# Patient Record
Sex: Male | Born: 1970 | Race: White | Hispanic: No | Marital: Single | Smoking: Never smoker
Health system: Southern US, Community
[De-identification: ages and names within clinical notes are randomized; demographics above are authoritative.]

---

## 2002-05-07 ENCOUNTER — Emergency Department (HOSPITAL_COMMUNITY): Admission: EM | Admit: 2002-05-07 | Discharge: 2002-05-07 | Payer: Self-pay | Admitting: Emergency Medicine

## 2006-08-01 ENCOUNTER — Emergency Department (HOSPITAL_COMMUNITY): Admission: EM | Admit: 2006-08-01 | Discharge: 2006-08-01 | Payer: Self-pay | Admitting: Emergency Medicine

## 2009-06-01 ENCOUNTER — Emergency Department (HOSPITAL_COMMUNITY): Admission: EM | Admit: 2009-06-01 | Discharge: 2009-06-01 | Payer: Self-pay | Admitting: Emergency Medicine

## 2010-11-28 LAB — DIFFERENTIAL
Basophils Absolute: 0 10*3/uL (ref 0.0–0.1)
Eosinophils Absolute: 0.1 10*3/uL (ref 0.0–0.7)
Eosinophils Relative: 2 % (ref 0–5)
Lymphocytes Relative: 49 % — ABNORMAL HIGH (ref 12–46)
Lymphs Abs: 3.8 10*3/uL (ref 0.7–4.0)
Monocytes Absolute: 0.7 10*3/uL (ref 0.1–1.0)
Neutro Abs: 3.1 10*3/uL (ref 1.7–7.7)
Neutrophils Relative %: 41 % — ABNORMAL LOW (ref 43–77)

## 2010-11-28 LAB — BASIC METABOLIC PANEL
BUN: 12 mg/dL (ref 6–23)
CO2: 26 mEq/L (ref 19–32)
Chloride: 101 mEq/L (ref 96–112)
GFR calc non Af Amer: >60 mL/min (ref >60)
Sodium: 135 mEq/L (ref 135–145)

## 2010-11-28 LAB — POCT CARDIAC MARKERS
CKMB, poc: 1.4 ng/mL (ref 1.0–8.0)
Myoglobin, poc: 45 ng/mL (ref 12–200)

## 2010-11-28 LAB — CBC
Hemoglobin: 15.7 g/dL (ref 13.0–17.0)
MCHC: 35.6 g/dL (ref 30.0–36.0)
MCV: 97 fL (ref 78.0–100.0)
RDW: 12.2 % (ref 11.5–15.5)

## 2014-01-02 ENCOUNTER — Encounter (HOSPITAL_COMMUNITY): Payer: Self-pay | Admitting: Emergency Medicine

## 2014-01-02 ENCOUNTER — Emergency Department (HOSPITAL_COMMUNITY)
Admission: EM | Admit: 2014-01-02 | Discharge: 2014-01-02 | Disposition: A | Discharge status: Home / Self Care | Payer: BC Managed Care – PPO | Attending: Emergency Medicine | Admitting: Emergency Medicine

## 2014-01-02 DIAGNOSIS — R079 Chest pain, unspecified: Secondary | ICD-10-CM | POA: Insufficient documentation

## 2014-01-02 DIAGNOSIS — R748 Abnormal levels of other serum enzymes: Secondary | ICD-10-CM | POA: Insufficient documentation

## 2014-01-02 DIAGNOSIS — E860 Dehydration: Secondary | ICD-10-CM | POA: Insufficient documentation

## 2014-01-02 DIAGNOSIS — R42 Dizziness and giddiness: Secondary | ICD-10-CM | POA: Insufficient documentation

## 2014-01-02 DIAGNOSIS — Z7982 Long term (current) use of aspirin: Secondary | ICD-10-CM | POA: Insufficient documentation

## 2014-01-02 DIAGNOSIS — Z79899 Other long term (current) drug therapy: Secondary | ICD-10-CM | POA: Insufficient documentation

## 2014-01-02 DIAGNOSIS — E876 Hypokalemia: Secondary | ICD-10-CM

## 2014-01-02 DIAGNOSIS — Z791 Long term (current) use of non-steroidal anti-inflammatories (NSAID): Secondary | ICD-10-CM | POA: Insufficient documentation

## 2014-01-02 DIAGNOSIS — R209 Unspecified disturbances of skin sensation: Secondary | ICD-10-CM | POA: Insufficient documentation

## 2014-01-02 LAB — URINALYSIS, ROUTINE W REFLEX MICROSCOPIC
BILIRUBIN URINE: NEGATIVE
GLUCOSE, UA: NEGATIVE mg/dL
HGB URINE DIPSTICK: NEGATIVE
Ketones, ur: NEGATIVE mg/dL
LEUKOCYTES UA: NEGATIVE
Nitrite: NEGATIVE
PH: 6 (ref 5.0–8.0)
Protein, ur: NEGATIVE mg/dL
Specific Gravity, Urine: 1.018 (ref 1.005–1.030)
UROBILINOGEN UA: 0.2 mg/dL (ref 0.0–1.0)

## 2014-01-02 LAB — CBC
HCT: 40.7 % (ref 39.0–52.0)
HEMOGLOBIN: 14.6 g/dL (ref 13.0–17.0)
MCH: 34 pg (ref 26.0–34.0)
MCHC: 35.9 g/dL (ref 30.0–36.0)
MCV: 94.7 fL (ref 78.0–100.0)
Platelets: 202 10*3/uL (ref 150–400)
RBC: 4.3 MIL/uL (ref 4.22–5.81)
RDW: 11.7 % (ref 11.5–15.5)
WBC: 5.5 10*3/uL (ref 4.0–10.5)

## 2014-01-02 LAB — BASIC METABOLIC PANEL
BUN: 11 mg/dL (ref 6–23)
CHLORIDE: 101 meq/L (ref 96–112)
CO2: 27 meq/L (ref 19–32)
Calcium: 9.5 mg/dL (ref 8.4–10.5)
Creatinine, Ser: 1.15 mg/dL (ref 0.50–1.35)
GFR calc non Af Amer: 76 mL/min — ABNORMAL LOW (ref >90)
GFR, EST AFRICAN AMERICAN: 89 mL/min — AB (ref >90)
Glucose, Bld: 138 mg/dL — ABNORMAL HIGH (ref 70–99)
POTASSIUM: 3.4 meq/L — AB (ref 3.7–5.3)
Sodium: 141 mEq/L (ref 137–147)

## 2014-01-02 LAB — I-STAT TROPONIN, ED: Troponin i, poc: 0 ng/mL (ref 0.00–0.08)

## 2014-01-02 LAB — CK: Total CK: 275 U/L — ABNORMAL HIGH (ref 7–232)

## 2014-01-02 MED ORDER — POTASSIUM CHLORIDE CRYS ER 20 MEQ PO TBCR
40.0000 meq | EXTENDED_RELEASE_TABLET | Freq: Once | ORAL | Status: AC
  Administered 2014-01-02: 40 meq via ORAL
  Filled 2014-01-02: qty 2

## 2014-01-02 MED ORDER — SODIUM CHLORIDE 0.9 % IV BOLUS (SEPSIS)
1000.0000 mL | Freq: Once | INTRAVENOUS | Status: AC
  Administered 2014-01-02: 1000 mL via INTRAVENOUS

## 2014-01-02 NOTE — Discharge Instructions (Signed)
As discussed, it is important that you follow up with your physician for continued management of your condition.  Please be sure to have your potassium re-checked.  If you develop any new, or concerning changes in your condition, please return to the emergency department immediately.

## 2014-01-02 NOTE — ED Notes (Signed)
Per pt sts that he had an episode of chest pain, dizziness and his right arm felt funny about 30 minutes ago. Pt tachy at triage in the 130s. sts he has been working in the heat today and walking a lot.

## 2014-01-02 NOTE — ED Notes (Signed)
Family at bedside. Pt given water to drink.

## 2014-01-02 NOTE — ED Provider Notes (Signed)
CSN: [id number]     Arrival date & time 01/02/14  1627 History   First MD Initiated Contact with Patient 01/02/14 1739     Chief Complaint  Patient presents with  . Dizziness  . Chest Pain      HPI  Patient presents after an episode of dizziness, chest pain, right arm dysesthesia. Symptoms began earlier today, have resolved almost entirely prior to my evaluation, and specifically the patient has no ongoing chest pain or lightheadedness. Patient is in generally well until the onset of symptoms. Patient does note that he has been recently very busy at work, with significant exertion, including outdoors, where is currently 85. Patient denies history of cardiac disease, smoking. Patient does drink. Symptoms resolved without clear intervention. When the pain is there was brief, sternal, discomfort, nonradiating, with no associated dyspnea. Patient denies recent fevers, chills, cough, lower extremity edema.   History reviewed. No pertinent past medical history. History reviewed. No pertinent past surgical history. History reviewed. No pertinent family history. History  Substance Use Topics  . Smoking status: Never Smoker   . Smokeless tobacco: Not on file  . Alcohol Use: Yes    Review of Systems  Constitutional:       Per HPI, otherwise negative  HENT:       Per HPI, otherwise negative  Respiratory:       Per HPI, otherwise negative  Cardiovascular:       Per HPI, otherwise negative  Gastrointestinal: Negative for vomiting.  Endocrine:       Negative aside from HPI  Genitourinary:       Neg aside from HPI   Musculoskeletal:       Per HPI, otherwise negative  Skin: Negative.   Neurological: Negative for syncope.      Allergies  Review of patient's allergies indicates no known allergies.  Home Medications   Prior to Admission medications   Medication Sig Start Date End Date Taking? Authorizing Provider  Aspirin-Salicylamide-Caffeine (BC HEADACHE) 325-95-16 MG  TABS Take 1 Package by mouth daily as needed (for pain).   Yes Historical Provider, MD  fexofenadine (ALLEGRA) 180 MG tablet Take 180 mg by mouth daily.   Yes Historical Provider, MD  ibuprofen (ADVIL,MOTRIN) 200 MG tablet Take 400 mg by mouth every 6 (six) hours as needed for moderate pain.   Yes Historical Provider, MD  Multiple Vitamin (MULTIVITAMIN WITH MINERALS) TABS tablet Take 1 tablet by mouth daily.   Yes Historical Provider, MD  ranitidine (ZANTAC) 150 MG tablet Take 150 mg by mouth daily.   Yes Historical Provider, MD   BP 142/85  Pulse 135  Temp(Src) 98.2 F (36.8 C)  Resp 18  Wt 185 lb (83.915 kg)  SpO2 98% Physical Exam  Nursing note and vitals reviewed. Constitutional: He is oriented to person, place, and time. He appears well-developed. No distress.  HENT:  Head: Normocephalic and atraumatic.  Eyes: Conjunctivae and EOM are normal.  Cardiovascular: Normal rate and regular rhythm.   Pulmonary/Chest: Effort normal. No stridor. No respiratory distress.  Abdominal: He exhibits no distension.  Musculoskeletal: He exhibits no edema.  Neurological: He is alert and oriented to person, place, and time. He displays no atrophy and no tremor. No cranial nerve deficit or sensory deficit. He exhibits normal muscle tone. He displays no seizure activity. Coordination normal.  Skin: Skin is warm and dry.  Psychiatric: He has a normal mood and affect. His speech is normal and behavior is normal. Thought content normal.  ED Course  Procedures (including critical care time) Labs Review Labs Reviewed  BASIC METABOLIC PANEL - Abnormal; Notable for the following:    Potassium 3.4 (*)    Glucose, Bld 138 (*)    GFR calc non Af Amer 76 (*)    GFR calc Af Amer 89 (*)    All other components within normal limits  CBC  CK  URINALYSIS, ROUTINE W REFLEX MICROSCOPIC  I-STAT TROPOININ, ED    Cardiac: 125 st, abn  100%ra, nml   EKG Interpretation   Date/Time:  Monday Jan 02 2014  16:31:47 EDT Ventricular Rate:  130 PR Interval:  154 QRS Duration: 86 QT Interval:  294 QTC Calculation: 432 R Axis:   59 Text Interpretation:  Sinus tachycardia Otherwise normal ECG Sinus  tachycardia Abnormal ekg Confirmed by Gerhard MunchLOCKWOOD, Brianne Maina  MD [phone]([id number]) on  01/02/2014 6:00:01 PM     Update: Patient in no distress, states that he feels better.  HR 95-100.  NS L #2 runnning.  MDM   This generally well-appearing male presents with palpitations.  On exam he is awake, alert, neurologically intact and hemodynamically stable.  Patient's minimal risk profile for thromboembolic processes or ACS. Patient's evaluation is largely reassuring, though elevated CK, and hypokalemia suggest ongoing dehydration. Patient presents currently with 2 L IV fluids, had no new complaints, no evidence of atypical his yes or systemic infection. Patient was discharged to follow up with primary care.  Gerhard Munchobert Rhyan Wolters, MD 01/02/14 2033

## 2015-01-28 ENCOUNTER — Emergency Department (HOSPITAL_COMMUNITY)
Admission: EM | Admit: 2015-01-28 | Discharge: 2015-01-28 | Disposition: A | Discharge status: Home / Self Care | Payer: No Typology Code available for payment source | Source: Home / Self Care | Attending: Family Medicine | Admitting: Family Medicine

## 2015-01-28 ENCOUNTER — Encounter (HOSPITAL_COMMUNITY): Payer: Self-pay | Admitting: *Deleted

## 2015-01-28 ENCOUNTER — Emergency Department (INDEPENDENT_AMBULATORY_CARE_PROVIDER_SITE_OTHER): Payer: No Typology Code available for payment source

## 2015-01-28 DIAGNOSIS — S63502A Unspecified sprain of left wrist, initial encounter: Secondary | ICD-10-CM | POA: Diagnosis not present

## 2015-01-28 NOTE — Discharge Instructions (Signed)
Ice, advil and wrist splint as needed, see orthopedist if further problems

## 2015-01-28 NOTE — ED Notes (Signed)
MED  R   WRIST  SPLINT  APPLIED

## 2015-01-28 NOTE — ED Provider Notes (Signed)
CSN: [id number]     Arrival date & time 01/28/15  1857 History   First MD Initiated Contact with Patient 01/28/15 1920     Chief Complaint  Patient presents with  . Wrist Pain   (Consider location/radiation/quality/duration/timing/severity/associated sxs/prior Treatment) Patient is a 44 y.o. male presenting with wrist pain. The history is provided by the patient.  Wrist Pain This is a new problem. The current episode started yesterday (fell into pool with injury to left wrist .). The problem has not changed since onset.Pertinent negatives include no chest pain and no abdominal pain.    History reviewed. No pertinent past medical history. History reviewed. No pertinent past surgical history. History reviewed. No pertinent family history. History  Substance Use Topics  . Smoking status: Never Smoker   . Smokeless tobacco: Not on file  . Alcohol Use: Yes    Review of Systems  Constitutional: Negative.   HENT: Negative for ear discharge, ear pain and facial swelling.   Cardiovascular: Negative for chest pain.  Gastrointestinal: Negative for abdominal pain.  Musculoskeletal: Positive for joint swelling. Negative for gait problem.  Skin: Negative.     Allergies  Review of patient's allergies indicates no known allergies.  Home Medications   Prior to Admission medications   Medication Sig Start Date End Date Taking? Authorizing Provider  Aspirin-Salicylamide-Caffeine (BC HEADACHE) 325-95-16 MG TABS Take 1 Package by mouth daily as needed (for pain).    Historical Provider, MD  fexofenadine (ALLEGRA) 180 MG tablet Take 180 mg by mouth daily.    Historical Provider, MD  ibuprofen (ADVIL,MOTRIN) 200 MG tablet Take 400 mg by mouth every 6 (six) hours as needed for moderate pain.    Historical Provider, MD  Multiple Vitamin (MULTIVITAMIN WITH MINERALS) TABS tablet Take 1 tablet by mouth daily.    Historical Provider, MD  ranitidine (ZANTAC) 150 MG tablet Take 150 mg by mouth daily.     Historical Provider, MD   BP 158/95 mmHg  Pulse 100  Temp(Src) 98.3 F (36.8 C) (Oral)  Resp 18  SpO2 100% Physical Exam  Constitutional: He is oriented to person, place, and time. He appears well-developed and well-nourished.  HENT:  Right Ear: External ear normal.  Left Ear: External ear normal.  Neck: Normal range of motion. Neck supple.  Musculoskeletal: He exhibits tenderness.       Left wrist: He exhibits decreased range of motion, tenderness, bony tenderness and swelling. He exhibits no effusion and no deformity.  Lymphadenopathy:    He has no cervical adenopathy.  Neurological: He is alert and oriented to person, place, and time.  Skin: Skin is warm and dry.  Nursing note and vitals reviewed.   ED Course  Procedures (including critical care time) Labs Review Labs Reviewed - No data to display  Imaging Review Dg Wrist Complete Left  01/28/2015   CLINICAL DATA:  Larey SeatFell yesterday and injured left wrist.  EXAM: LEFT WRIST - COMPLETE 3+ VIEW  COMPARISON:  None.  FINDINGS: The joint spaces are maintained. No acute fracture is identified. Rounded bony density near the ulnar styloid is either an old avulsion fracture or unfused secondary ossification center.  IMPRESSION: No acute wrist fracture.   Electronically Signed   By: Rudie MeyerP.  Gallerani M.D.   On: 01/28/2015 19:51    X-rays reviewed and report per radiologist.   MDM   1. Sprain of left wrist, initial encounter        Linna HoffJames D Derel Mcglasson, MD 01/29/15 2044

## 2015-01-28 NOTE — ED Notes (Signed)
Pt  Reports he  [place]  [employer]  [place]  [facility]  And  Injured  His  l   Wrist     -  He  Has  Pain  Present      Cap refill is brisk       Pt  Also  Reports   Some  Pain l  Ear   And   Some  Swelling  Of  The  Glands  Behind  His  l  Ear     He  Ambulated  To  The  Room  With a  Steady  Fluid gait

## 2017-02-02 ENCOUNTER — Encounter (HOSPITAL_COMMUNITY): Payer: Self-pay | Admitting: Emergency Medicine

## 2017-02-02 ENCOUNTER — Ambulatory Visit (HOSPITAL_COMMUNITY)
Admission: EM | Admit: 2017-02-02 | Discharge: 2017-02-02 | Disposition: A | Discharge status: Home / Self Care | Payer: BLUE CROSS/BLUE SHIELD | Attending: Internal Medicine | Admitting: Internal Medicine

## 2017-02-02 DIAGNOSIS — H8112 Benign paroxysmal vertigo, left ear: Secondary | ICD-10-CM | POA: Diagnosis not present

## 2017-02-02 MED ORDER — MECLIZINE HCL 25 MG PO TABS: 25.0000 mg | ORAL_TABLET | Freq: Three times a day (TID) | ORAL | 0 refills | Status: AC | PRN

## 2017-02-02 NOTE — ED Triage Notes (Signed)
[facility] a [facility] of a sudden on set of dizziness that occurred this morning. The patient denied dizziness at this time. The patient did report being in the water a lot this past weekend.

## 2017-02-02 NOTE — Discharge Instructions (Signed)
Move head and body slowly to help prevent the dizziness. Be sure to drink plenty of fluids and stay well-hydrated. Take the meclizine as directed. This may cause drowsiness. If you develop worsening, vomiting, unrelenting dizziness, headache, weakness on one side of the body dizziness or other symptoms he may need to go to the urgent department.

## 2017-02-02 NOTE — ED Provider Notes (Signed)
CSN: [id number]     Arrival date & time 02/02/17  1138 History   First MD Initiated Contact with Patient 02/02/17 1336     Chief Complaint  Patient presents with  . Dizziness   (Consider location/radiation/quality/duration/timing/severity/associated sxs/prior Treatment) 46 year old male states that he got up this morning feeling generally well but when he stepped over a dividing gait in the house and turned his head sideways C had an acute onset of vertigo. He states that he had to lay down and crawl to the carpet and then upstairs to lay down. He states it lasted maybe 5-10 minutes before gradually resolving. He lay down to take a nap and woke up feeling only a little lightheaded but no vertigo. He had temporary nausea at the outset but no vomiting. He still feels a little lightheaded but no vertigo at this time. No problems with vision, speech, hearing, swallowing, strength, movement, sensation, concentration, memory. States he is never had this sensation before. He does work outside in the heat but admits that he drinks plenty of fluids.      History reviewed. No pertinent past medical history. History reviewed. No pertinent surgical history. History reviewed. No pertinent family history. Social History  Substance Use Topics  . Smoking status: Never Smoker  . Smokeless tobacco: Never Used  . Alcohol use Yes    Review of Systems  Constitutional: Positive for activity change. Negative for appetite change, chills and fever.  HENT: Negative for congestion, ear discharge, ear pain, postnasal drip, rhinorrhea, sore throat, tinnitus, trouble swallowing and voice change.   Eyes: Negative.  Negative for visual disturbance.  Respiratory: Negative.   Cardiovascular: Negative for chest pain, palpitations and leg swelling.  Gastrointestinal: Positive for nausea and vomiting. Negative for abdominal pain.  Genitourinary: Negative.   Musculoskeletal: Negative.   Skin: Negative.   Neurological:  Positive for dizziness and light-headedness. Negative for tremors, seizures, syncope, facial asymmetry, speech difficulty, numbness and headaches.  Hematological: Negative.   Psychiatric/Behavioral: Negative for agitation, behavioral problems, confusion, decreased concentration, dysphoric mood and hallucinations. The patient is not hyperactive.     Allergies  Patient has no known allergies.  Home Medications   Prior to Admission medications   Medication Sig Start Date End Date Taking? Authorizing Provider  meclizine (ANTIVERT) 25 MG tablet Take 1 tablet (25 mg total) by mouth 3 (three) times daily as needed for dizziness. 02/02/17   Hayden Rasmussen, NP   Meds Ordered and Administered this Visit  Medications - No data to display  BP (!) 149/70 (BP Location: Right Arm)   Pulse (!) 111   Temp 98.4 F (36.9 C) (Oral)   Resp 16   SpO2 100%  No data found.   Physical Exam  Constitutional: He is oriented to person, place, and time. He appears well-developed and well-nourished. No distress.  HENT:  Head: Normocephalic and atraumatic.  Bilateral TMs are normal. Oropharynx is clear. Soft palate rises symmetrically. Timing and uvula midline.   Eyes: Conjunctivae and EOM are normal. Pupils are equal, round, and reactive to light. Right eye exhibits no discharge. Left eye exhibits no discharge.  Neck: Normal range of motion. Neck supple. No tracheal deviation present.  Cardiovascular: Normal rate, regular rhythm, normal heart sounds and intact distal pulses.  Exam reveals no gallop.   No murmur heard. Pulmonary/Chest: Effort normal and breath sounds normal. No respiratory distress. He has no wheezes. He has no rales.  Abdominal: Soft. There is no tenderness. There is no rebound.  Musculoskeletal:  Normal range of motion. He exhibits no edema or tenderness.  Lymphadenopathy:    He has no cervical adenopathy.  Neurological: He is alert and oriented to person, place, and time. He has normal  strength. He displays no tremor. No cranial nerve deficit or sensory deficit. He exhibits normal muscle tone. He displays a negative Romberg sign. He displays no seizure activity. Coordination and gait normal. GCS eye subscore is 4. GCS verbal subscore is 5. GCS motor subscore is 6.  Minor sway with Romberg but maintains station. Heel to toe well-balanced. Strength of upper and lower extremities 5 over 5 and symmetric. Unable to create nystagmus  Skin: Skin is warm and dry. No rash noted.  Nursing note and vitals reviewed.   Urgent Care Course     Procedures (including critical care time)  Labs Review Labs Reviewed - No data to display  Imaging Review No results found.   Visual Acuity Review  Right Eye Distance:   Left Eye Distance:   Bilateral Distance:    Right Eye Near:   Left Eye Near:    Bilateral Near:         MDM   1. Benign paroxysmal positional vertigo of left ear    Move head and body slowly to help prevent the dizziness. Be sure to drink plenty of fluids and stay well-hydrated. Take the meclizine as directed. This may cause drowsiness. If you develop worsening, vomiting, unrelenting dizziness, headache, weakness on one side of the body dizziness or other symptoms he may need to go to the urgent department. Meds ordered this encounter  Medications  . meclizine (ANTIVERT) 25 MG tablet    Sig: Take 1 tablet (25 mg total) by mouth 3 (three) times daily as needed for dizziness.    Dispense:  30 tablet    Refill:  0    Order Specific Question:   Supervising Provider    Answer:   Eustace MooreMURRAY, LAURA W [[id number]]       Hayden RasmussenMabe, Eastin Swing, NP 02/02/17 1405

## 2019-02-08 ENCOUNTER — Encounter (HOSPITAL_COMMUNITY): Payer: Self-pay

## 2019-02-08 ENCOUNTER — Ambulatory Visit (INDEPENDENT_AMBULATORY_CARE_PROVIDER_SITE_OTHER): Payer: BLUE CROSS/BLUE SHIELD

## 2019-02-08 ENCOUNTER — Ambulatory Visit (HOSPITAL_COMMUNITY)
Admission: EM | Admit: 2019-02-08 | Discharge: 2019-02-08 | Disposition: A | Discharge status: Home / Self Care | Payer: BLUE CROSS/BLUE SHIELD | Attending: Emergency Medicine | Admitting: Emergency Medicine

## 2019-02-08 ENCOUNTER — Other Ambulatory Visit: Payer: Self-pay

## 2019-02-08 DIAGNOSIS — S60221A Contusion of right hand, initial encounter: Secondary | ICD-10-CM

## 2019-02-08 DIAGNOSIS — W208XXA Other cause of strike by thrown, projected or falling object, initial encounter: Secondary | ICD-10-CM

## 2019-02-08 DIAGNOSIS — W228XXA Striking against or struck by other objects, initial encounter: Secondary | ICD-10-CM | POA: Diagnosis not present

## 2019-02-08 DIAGNOSIS — S6991XA Unspecified injury of right wrist, hand and finger(s), initial encounter: Secondary | ICD-10-CM | POA: Diagnosis not present

## 2019-02-08 NOTE — Discharge Instructions (Signed)
NO fracture   Use anti-inflammatories for pain/swelling. You may take up to 800 mg Ibuprofen every 8 hours with food. You may supplement Ibuprofen with Tylenol [phone] every 8 hours.   Ice and Ace wrap  Follow up if not improving

## 2019-02-08 NOTE — ED Provider Notes (Signed)
[facility]    CSN: [id number] Arrival date & time: 02/08/19  1159      History   Chief Complaint Chief Complaint  Patient presents with  . Hand Injury    HPI William Holder is a 48 y.o. male is significant past medical history presenting today for evaluation of right hand injury.  Earlier today while at work a large piece of metal fell on his right hand.  Since he has developed pain and swelling in his pain that will radiate up into his forearm.  He does endorse some mild tingling and numbness in his fingers.  Denies difficulty bending fingers.  Denies previous injury to hand.  Pain currently 5 out of 10.  Has not taken anything for his pain.  HPI  History reviewed. No pertinent past medical history.  There are no active problems to display for this patient.   History reviewed. No pertinent surgical history.     Home Medications    Prior to Admission medications   Medication Sig Start Date End Date Taking? Authorizing Provider  meclizine (ANTIVERT) 25 MG tablet Take 1 tablet (25 mg total) by mouth 3 (three) times daily as needed for dizziness. 02/02/17   Janne Napoleon, NP    Family History History reviewed. No pertinent family history.  Social History Social History   Tobacco Use  . Smoking status: Never Smoker  . Smokeless tobacco: Never Used  Substance Use Topics  . Alcohol use: Yes  . Drug use: No     Allergies   Patient has no known allergies.   Review of Systems Review of Systems  Constitutional: Negative for fatigue and fever.  Eyes: Negative for redness, itching and visual disturbance.  Respiratory: Negative for shortness of breath.   Cardiovascular: Negative for chest pain and leg swelling.  Gastrointestinal: Negative for nausea and vomiting.  Musculoskeletal: Positive for arthralgias and joint swelling. Negative for myalgias.  Skin: Negative for color change, rash and wound.  Neurological: Negative for dizziness, syncope, weakness,  light-headedness and headaches.     Physical Exam Triage Vital Signs ED Triage Vitals  Enc Vitals Group     BP 02/08/19 1243 (!) 152/112     Pulse Rate 02/08/19 1243 93     Resp 02/08/19 1243 18     Temp 02/08/19 1243 98.3 F (36.8 C)     Temp Source 02/08/19 1243 Oral     SpO2 02/08/19 1243 98 %     Weight 02/08/19 1240 198 lb (89.8 kg)     Height --      Head Circumference --      Peak Flow --      Pain Score 02/08/19 1240 5     Pain Loc --      Pain Edu? --      Excl. in [place]? --    No data found.  Updated Vital Signs BP (!) 152/112 (BP Location: Right Arm)   Pulse 93   Temp 98.3 F (36.8 C) (Oral)   Resp 18   Wt 198 lb (89.8 kg)   SpO2 98%   Visual Acuity Right Eye Distance:   Left Eye Distance:   Bilateral Distance:    Right Eye Near:   Left Eye Near:    Bilateral Near:     Physical Exam Vitals signs and nursing note reviewed.  Constitutional:      Appearance: He is well-developed.     Comments: No acute distress  HENT:  Head: Normocephalic and atraumatic.     Nose: Nose normal.  Eyes:     Conjunctiva/sclera: Conjunctivae normal.  Neck:     Musculoskeletal: Neck supple.  Cardiovascular:     Rate and Rhythm: Normal rate.  Pulmonary:     Effort: Pulmonary effort is normal. No respiratory distress.  Abdominal:     General: There is no distension.  Musculoskeletal: Normal range of motion.     Comments: Right dorsum of hand with swelling to proximal second metacarpal/carpal area.  No snuffbox tenderness, nontender distal radius and ulna.  Tenderness throughout second and third metacarpals.  Full active range of motion of all fingers Radial pulse 2+, cap refill less than 2 seconds, sensation intact distally  Skin:    General: Skin is warm and dry.  Neurological:     Mental Status: He is alert and oriented to person, place, and time.      UC Treatments / Results  Labs (all labs ordered are listed, but only abnormal results are displayed) Labs  Reviewed - No data to display  EKG None  Radiology Dg Hand Complete Right  Result Date: 02/08/2019 CLINICAL DATA:  Pain EXAM: RIGHT HAND - COMPLETE 3+ VIEW COMPARISON:  None. FINDINGS: There is no evidence of fracture or dislocation. There is no evidence of arthropathy or other focal bone abnormality. Soft tissues are unremarkable. IMPRESSION: Negative. Electronically Signed   By: Katherine Mantlehristopher  Green M.D.   On: 02/08/2019 13:27    Procedures Procedures (including critical care time)  Medications Ordered in UC Medications - No data to display  Initial Impression / Assessment and Plan / UC Course  I have reviewed the triage vital signs and the nursing notes.  Pertinent labs & imaging results that were available during my care of the patient were reviewed by me and considered in my medical decision making (see chart for details).     X-ray negative for acute bony abnormality.  Most likely contusion/sprain.  Will treat as such with Ace wrap, anti-inflammatories ice and rest.  Monitor for gradual resolution.Discussed strict return precautions. Patient verbalized understanding and is agreeable with plan.  Final Clinical Impressions(s) / UC Diagnoses   Final diagnoses:  Contusion of right hand, initial encounter     Discharge Instructions     NO fracture   Use anti-inflammatories for pain/swelling. You may take up to 800 mg Ibuprofen every 8 hours with food. You may supplement Ibuprofen with Tylenol [phone] mg every 8 hours.   Ice and Ace wrap  Follow up if not improving   ED Prescriptions    None     Controlled Substance Prescriptions [place] Controlled Substance Registry consulted? Not Applicable   Lew DawesWieters, Jarquis Walker C, [place]PA-C 02/08/19 1335

## 2019-02-08 NOTE — ED Triage Notes (Signed)
Pt states he was working 2 hours ago and a piece of metal fell on his right hand.

## 2020-10-29 IMAGING — DX RIGHT HAND - COMPLETE 3+ VIEW
4 series · 4 of 4 positions shown · non-contrast
Comparison: None.

CLINICAL DATA: Pain

EXAM:
RIGHT HAND - COMPLETE 3+ VIEW

[hand pa]
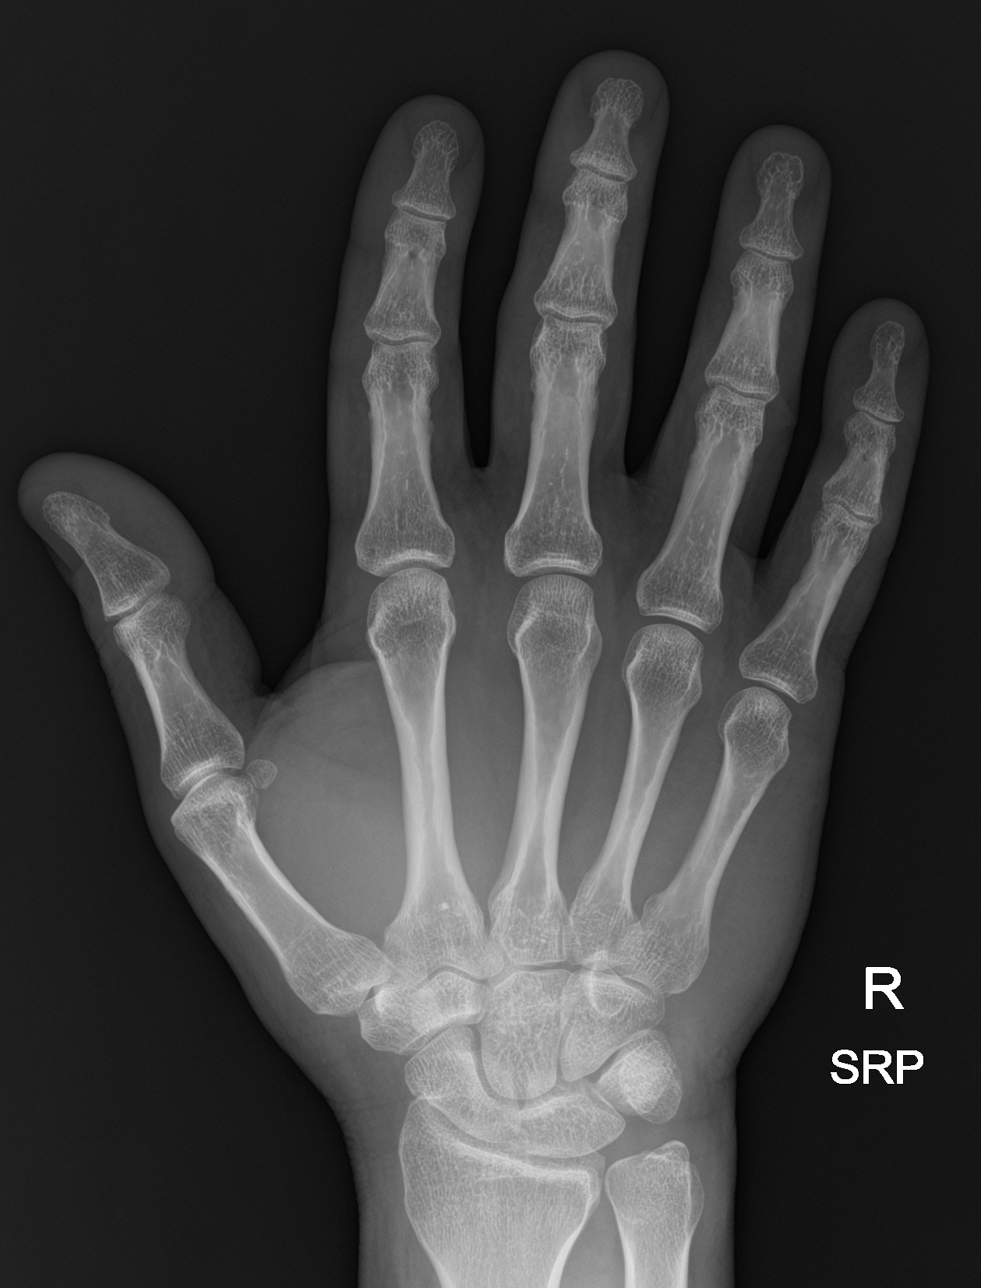

[hand obl]
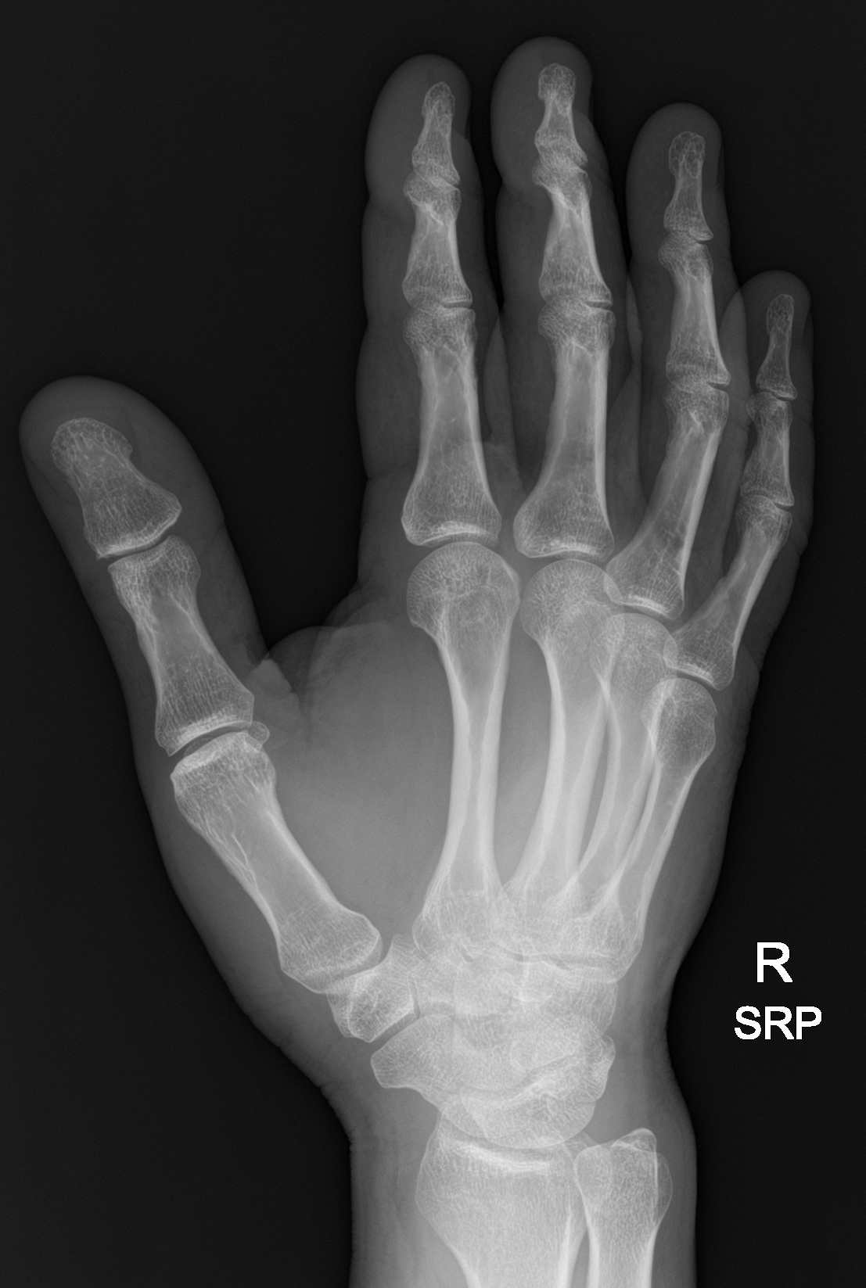

[hand lat (1 of 2)]
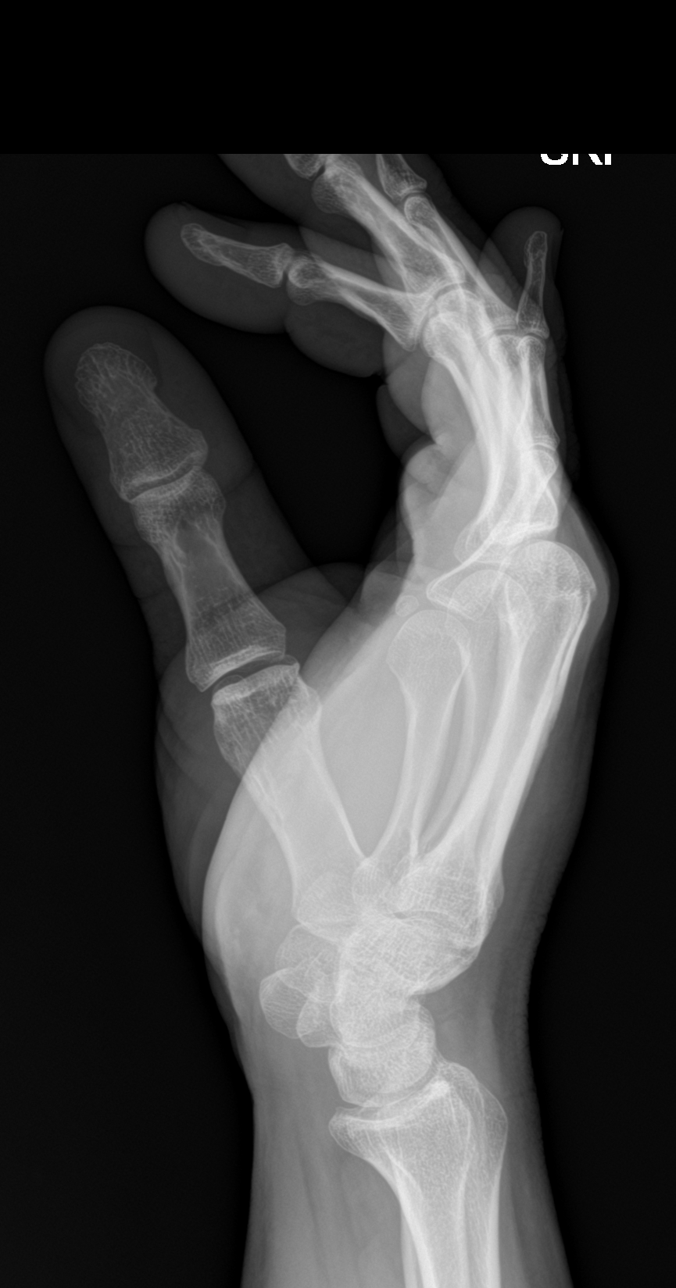

[hand lat (2 of 2)]
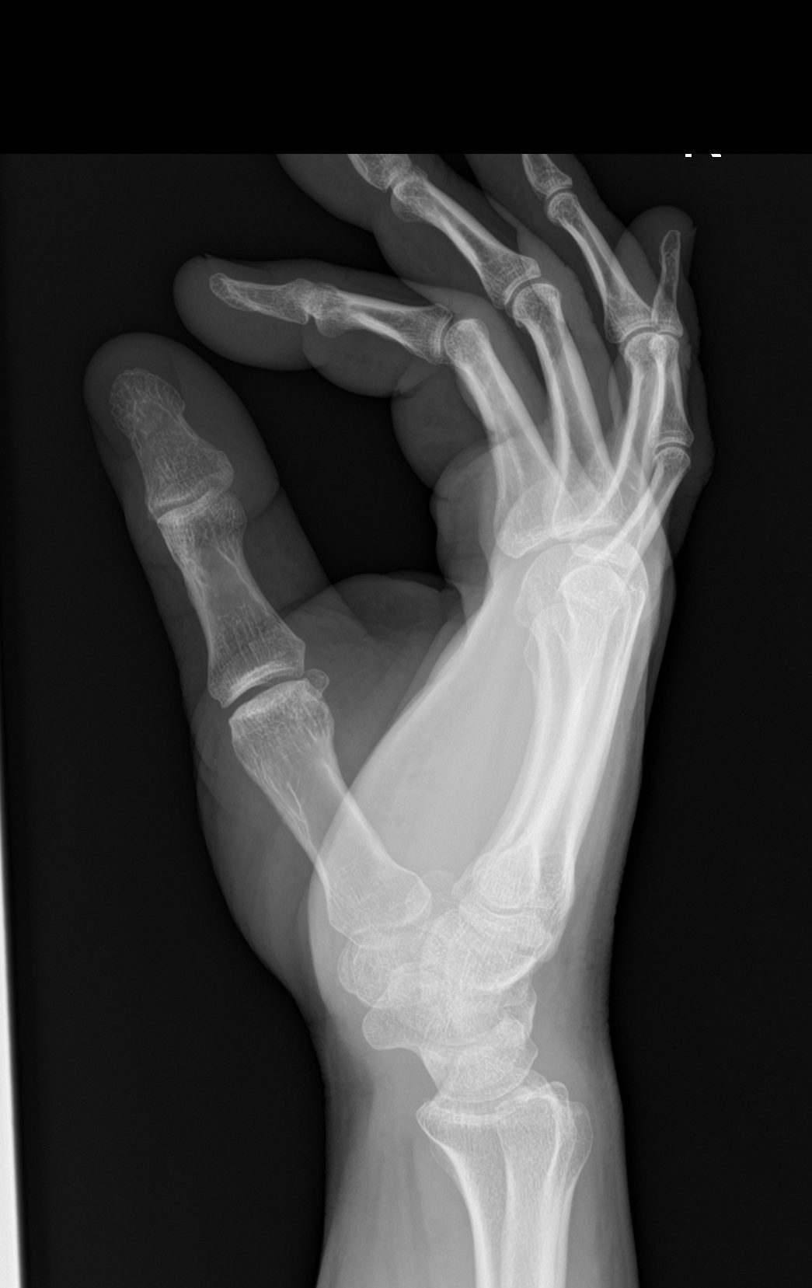

[4 of 4 positions shown; findings below may reference images not displayed]

FINDINGS: There is no evidence of fracture or dislocation. There is no
evidence of arthropathy or other focal bone abnormality. Soft
tissues are unremarkable.
IMPRESSION: Negative.
